# Patient Record
Sex: Female | Born: 1935 | Race: White | Hispanic: No
Health system: Southern US, Community
[De-identification: ages and names within clinical notes are randomized; demographics above are authoritative.]

---

## 2014-01-25 ENCOUNTER — Inpatient Hospital Stay: Payer: Self-pay | Admitting: Internal Medicine

## 2014-01-25 LAB — CBC WITH DIFFERENTIAL/PLATELET
Basophil #: 0.1 10*3/uL (ref 0.0–0.1)
Basophil %: 1.1 %
EOS ABS: 0.2 10*3/uL (ref 0.0–0.7)
Eosinophil %: 1.4 %
HCT: 34.9 % — ABNORMAL LOW (ref 35.0–47.0)
HGB: 11.8 g/dL — ABNORMAL LOW (ref 12.0–16.0)
LYMPHS PCT: 21.7 %
Lymphocyte #: 2.5 10*3/uL (ref 1.0–3.6)
MCH: 32 pg (ref 26.0–34.0)
MCHC: 33.8 g/dL (ref 32.0–36.0)
MCV: 95 fL (ref 80–100)
MONO ABS: 1 x10 3/mm — AB (ref 0.2–0.9)
MONOS PCT: 8.6 %
Neutrophil #: 7.7 10*3/uL — ABNORMAL HIGH (ref 1.4–6.5)
Neutrophil %: 67.2 %
Platelet: 198 10*3/uL (ref 150–440)
RBC: 3.69 10*6/uL — ABNORMAL LOW (ref 3.80–5.20)
RDW: 12.9 % (ref 11.5–14.5)
WBC: 11.4 10*3/uL — ABNORMAL HIGH (ref 3.6–11.0)

## 2014-01-25 LAB — COMPREHENSIVE METABOLIC PANEL
Albumin: 3.6 g/dL (ref 3.4–5.0)
Alkaline Phosphatase: 86 U/L
Anion Gap: 9 (ref 7–16)
BILIRUBIN TOTAL: 0.5 mg/dL (ref 0.2–1.0)
BUN: 19 mg/dL — ABNORMAL HIGH (ref 7–18)
CHLORIDE: 106 mmol/L (ref 98–107)
CO2: 22 mmol/L (ref 21–32)
CREATININE: 1.45 mg/dL — AB (ref 0.60–1.30)
Calcium, Total: 9 mg/dL (ref 8.5–10.1)
EGFR (Non-African Amer.): 36 — ABNORMAL LOW
GFR CALC AF AMER: 44 — AB
Glucose: 200 mg/dL — ABNORMAL HIGH (ref 65–99)
Osmolality: 282 (ref 275–301)
Potassium: 3.9 mmol/L (ref 3.5–5.1)
SGOT(AST): 37 U/L (ref 15–37)
SGPT (ALT): 44 U/L
SODIUM: 137 mmol/L (ref 136–145)
Total Protein: 6.7 g/dL (ref 6.4–8.2)

## 2014-01-25 LAB — PROTIME-INR
INR: 1.7
PROTHROMBIN TIME: 19.9 s — AB (ref 11.5–14.7)

## 2014-01-25 LAB — CLOSTRIDIUM DIFFICILE(ARMC)

## 2014-01-25 LAB — LIPASE, BLOOD: Lipase: 78 U/L (ref 73–393)

## 2014-01-26 LAB — CBC WITH DIFFERENTIAL/PLATELET
BASOS PCT: 0.8 %
Basophil #: 0.1 10*3/uL (ref 0.0–0.1)
EOS ABS: 0.2 10*3/uL (ref 0.0–0.7)
EOS PCT: 2.5 %
HCT: 33.6 % — ABNORMAL LOW (ref 35.0–47.0)
HGB: 11.2 g/dL — ABNORMAL LOW (ref 12.0–16.0)
Lymphocyte #: 1.8 10*3/uL (ref 1.0–3.6)
Lymphocyte %: 21.2 %
MCH: 31.9 pg (ref 26.0–34.0)
MCHC: 33.4 g/dL (ref 32.0–36.0)
MCV: 96 fL (ref 80–100)
MONOS PCT: 8.5 %
Monocyte #: 0.7 x10 3/mm (ref 0.2–0.9)
NEUTROS PCT: 67 %
Neutrophil #: 5.7 10*3/uL (ref 1.4–6.5)
PLATELETS: 188 10*3/uL (ref 150–440)
RBC: 3.51 10*6/uL — ABNORMAL LOW (ref 3.80–5.20)
RDW: 12.9 % (ref 11.5–14.5)
WBC: 8.5 10*3/uL (ref 3.6–11.0)

## 2014-01-26 LAB — PROTIME-INR
INR: 1.7
PROTHROMBIN TIME: 19.3 s — AB (ref 11.5–14.7)

## 2014-01-27 LAB — BASIC METABOLIC PANEL
Anion Gap: 9 (ref 7–16)
BUN: 10 mg/dL (ref 7–18)
CALCIUM: 8.4 mg/dL — AB (ref 8.5–10.1)
Chloride: 109 mmol/L — ABNORMAL HIGH (ref 98–107)
Co2: 25 mmol/L (ref 21–32)
Creatinine: 1.21 mg/dL (ref 0.60–1.30)
GFR CALC AF AMER: 54 — AB
GFR CALC NON AF AMER: 45 — AB
GLUCOSE: 106 mg/dL — AB (ref 65–99)
OSMOLALITY: 284 (ref 275–301)
Potassium: 3.9 mmol/L (ref 3.5–5.1)
SODIUM: 143 mmol/L (ref 136–145)

## 2014-01-27 LAB — PROTIME-INR
INR: 2.1
PROTHROMBIN TIME: 23.2 s — AB (ref 11.5–14.7)

## 2014-07-11 NOTE — H&P (Signed)
PATIENT NAME:  Tanya Mata, Tanya Mata MR#:  [id number] DATE OF BIRTH:  1924/12/03  PRIMARY DOCTOR:  Is from [place].  EMERGENCY ROOM PHYSICIAN:  Eartha InchCory R. York CeriseForbach, MD  CHIEF COMPLAINT: Diarrhea.   HISTORY OF PRESENT ILLNESS: An 81 year old female visiting her daughter. She is from [place] and the daughter is in [place].  The patient noticed to have diarrhea and she was at [facility] hospital for a day for diverticulitis, discharged on Friday. The patient still has diarrhea and abdominal pain, decreased p.o. intake. She was given Cipro and Flagyl on discharge from the hospital in [place]. She took Friday and Saturday. The patient had diarrhea nonstop and then abdominal pain. Did not take any medications today. She is evaluated in the Emergency Room and she has Clostridium difficile positive. We are admitting for C. difficile positive with persistent diarrhea.   Denies any nausea, vomiting, but unable to eat anything since yesterday due to her diarrhea. She is on liquid diet anyway for past 3 days. Denies other complaints. No fever. No cough. Traveling from [place] to visit her children in [place], [place]  Usually active and drives her car and lives by herself in [place].   PAST MEDICAL HISTORY: Significant for diverticulosis, history of multiple colonoscopies,  one recently a year ago showed diverticula, but negative for cancer. She has a history of hypertension, diabetes, chronic atrial fibrillation.   ALLERGIES: CODEINE.   SOCIAL HISTORY: Lives alone by herself. Independent for activities of daily living. The patient denies any smoking or drugs and no alcohol.   PAST SURGICAL HISTORY: Significant for history of hysterectomy before and gallbladder surgery.   MEDICATIONS: Sotalol 80 mg p.o. t.i.d., simvastatin 10 mg p.o. at bedtime, glipizide 5 mg p.o. b.i.d., Bentyl 10 mg p.o. t.i.d., Advair Diskus 250/50 b.i.d., Coumadin 4 mg daily.   REVIEW OF SYSTEMS:   CONSTITUTIONAL: The patient has no fever. No fatigue.  EYES: No blurred vision.  EARS, NOSE AND THROAT: No tinnitus. No epistaxis. No difficulty swallowing.  RESPIRATORY: No cough. No wheezing.  CARDIOVASCULAR: No chest pain. No orthopnea.  GASTROINTESTINAL: The patient has diarrhea and abdominal pain going on for 3 days, has some nausea, but denies any vomiting.  GENITOURINARY: No dysuria.  ENDOCRINE: No polyuria or polydipsia.  HEMATOLOGIC: No anemia.  MUSCULOSKELETAL: No joint pain.  NEUROLOGIC: No numbness and weakness. No dysarthria.   PSYCHIATRIC: No anxiety or insomnia.   PHYSICAL EXAMINATION: VITAL SIGNS: Temperature 98 Fahrenheit, heart rate 67, blood pressure 162/70, saturation 97% on room air.  GENERAL: Alert, awake, and oriented. An 81 year old female not in distress HEAD:  Atraumatic, normocephalic. EYES: Pupils equal, reacting to light. Extraocular movements intact.  EARS, NOSE, AND THROAT: No tympanic membrane congestion. No turbinate hypertrophy.  No oropharyngeal erythema.  NECK: Supple. No JVD. No carotid bruit. RESPIRATORY:  Clear to auscultation. No wheeze, no rales.  CARDIOVASCULAR: S1, S2 irregularly irregular. PMI not displaced.  GASTROINTESTINAL: Abdomen is slightly tender in epigastric area. No rebound tenderness. No organomegaly. No hernias.  MUSCULOSKELETAL: Able to move extremities x 4. No tenderness or effusion. Normal range of motion. Strength and tone are equal bilaterally.  SKIN: Inspection is normal. Skin turgor is decreased.  VASCULAR: Good pedal pulses.  NEUROLOGICAL: Alert, awake, oriented. Cranial nerves II through XII are intact. Power 5/5 in upper and lower extremities. Sensation intact. Deep tendon reflexes 2+ bilaterally.  PSYCHIATRIC: Mood and affect are within normal limits.   LABORATORY DATA:  Electrolytes, sodium 137, potassium 3.9, chloride 106,  bicarbonate 22, BUN 19, creatinine 1.45, glucose 200. LFTs are within normal limits. Lipase 78,  INR 1.7. WBC 11.4, hemoglobin 11.8, hematocrit 34.9, platelets 198,000.   IMAGING:  Abdominal CAT scan shows air-fluid levels in the colon, small bowel, adynamic ileus.  She has multiple diverticula. No diverticulitis. The patient's Clostridium difficile toxicology is positive for C. difficile antigen.    EKG shows sinus bradycardia at 49 beats per minute.   ASSESSMENT AND PLAN: 1.  This patient is an 81 year old female with persistent diarrhea with Clostridium difficile colitis. Admit her to hospitalist service. Start her on vancomycin 250 mg > q. 4 hours along with probiotics. Hold the Cipro and Flagyl at this time.  2.   Mild acute renal failure secondary to dehydration with diarrhea and poor p.o. intake for 3 days, so start intravenous fluids and monitor kidney function.  3.   Sinus bradycardia. Hold the sotalol and monitor on telemetry.  4.  History of diabetes mellitus type 2. The patient is taking glipizide at home, but we are going to do sliding scale with coverage and restart glipizide tomorrow.  5.  The patient has hyperlipidemia. Continue statins. 6.  History of irritable bowel syndrome. She is on Bentyl. Continue that.  7.  The patient has chronic atrial fibrillation. INR is 1.7 Coumadin, she takes at 4 mg, continue that.   TIME SPENT: About 55 minutes.    ____________________________ Katha Hamming, MD sk:LT D: 01/25/2014 19:09:42 ET T: 01/25/2014 19:28:18 ET JOB#: [id number]  cc: Katha Hamming, MD, <Dictator> Katha Hamming MD ELECTRONICALLY SIGNED 03/01/2014 23:30

## 2014-07-11 NOTE — Discharge Summary (Signed)
PATIENT NAME:  Tanya Mata, [name] MR#:  [id number] DATE OF BIRTH:  November 08, 1924  DATE OF ADMISSION:  01/25/2014 DATE OF DISCHARGE:  01/27/2014  CHIEF COMPLAINT AT THE TIME OF ADMISSION: Diarrhea.   ADMITTING DIAGNOSES:  1.  Persistent diarrhea secondary to Clostridium difficile colitis.  2.  Mild acute kidney injury secondary to dehydration.  3.  Sinus bradycardia, holding sotalol.   4.  Diabetes mellitus type 2.  5.  Hyperlipidemia.  6.  Irritable bowel syndrome.  7.  Chronic atrial fibrillation, on Coumadin.   DISCHARGE DIAGNOSES:  1.  Diarrhea from Clostridium difficile colitis, resolved.  2.  Acute kidney injury improved with intravenous fluids.  3.  Sinus bradycardia, continue to hold sotalol until evaluated by cardiology.  4.  Chronic history of atrial fibrillation, rate controlled, international normalized ratio is therapeutic, continue Coumadin.  5.  Diabetes mellitus, continue home medications.  6.  Hyperlipidemia, continue statin.  7.  Irritable bowel syndrome, continue Bentyl.   CONSULTATIONS: None.   PROCEDURES: None.   BRIEF HISTORY AND HOSPITAL COURSE: The patient is an 81 year old female who is visiting from [place] to visit her daughter in [place]. Initially she went to visit her daughter in [place], and at that time she started having diarrhea. The patient was evaluated in [facility] and discharged. Please review the history and physical for details. Though she was discharged she was persistently having diarrhea and abdominal pain with  decreased p.o. intake. At the time of discharge at [facility] she was given Cipro and Flagyl which she took for 2 days and stopped taking them. The patient came into the ED at [facility]. Stool for C. difficile toxin was positive and she is admitted to the hospital with a diagnosis of C. difficile colitis.   HOSPITAL COURSE:  1.  The patient was started on p.o. vancomycin as well as  probiotics. Ciprofloxacin and Flagyl were held.  With p.o. vancomycin her condition significantly improved,  diarrhea has gotten better. She started having some stool. Tolerating p.o. Abdominal pain is better.  2.  Acute renal failure is resolved with IV fluids.  3.  The patient was noticed to have sinus bradycardia, but the patient is on sotalol, which was held. Subsequently bradycardia was significantly improved. The patient was recommended to hold her sotalol until she is evaluated by her cardiologist in [place]. The patient sees Dr. Orvan Falconerampbell as an outpatient. She prefers following up with Dr. Orvan Falconerampbell and her cardiologist in [place].  4.  The patient has chronic history of atrial fibrillation. The rate is controlled. She is on Coumadin, which is resumed.  INR is therapeutic at the time of discharge.  5.  For history of irritable bowel syndrome, the patient is provided with Bentyl  6.  Hyperlipidemia. Continue statin.  7.  Diabetes mellitus. Continue home medication glipizide.  8.  The patient's overall condition is improved significantly. Plan is to discharge the patient home in stable condition.   LABORATORIES AND IMAGING STUDIES: BMP: Glucose is 106, BUN and creatinine are normal on November 10. GFR is 45. Anion gap is normal. Serum osmolality is normal. Calcium is 8.4. WBC 8.5, hemoglobin is 11.2, platelets are 188. On November 10 PT is 23.2 and INR is 2.1. At the time of admission stool for C. difficile toxin is positive, PCR is also positive. The patient had a CAT scan of the abdomen and pelvis on November 8.  This was done with contrast. The report has revealed air  fluid levels noted in the small bowel, which are nonspecific, adynamic ileus is suspected, no evidence of obstruction.  Numerous sigmoid colon diverticula.  No diverticulitis. Prominent dilatation of the intra- and extrahepatic bile ducts with normal distal tapering. This is presumed to be chronic in this post cholecystectomy  patient.   Vital signs at the time of discharge:  Temperature 98 degrees Fahrenheit, pulse 66, respirations 18,  pulse oximetry 99% on room air.   MEDICATIONS AT THE TIME OF DISCHARGE:  Coumadin 4 mg p.o. once daily, glipizide 5 mg p.o. 2 times a day, simvastatin 10 mg p.o. once daily, tramadol 50 mg 1 tablet p.o. every 4 hours as needed for pain, Dexilant 60 mg 1 capsule p.o. once daily, Advair 100/50 one puff inhalation 2 times a day, Allegra 1 tablet p.o. once daily, Align 4 mg 1 capsule p.o. once daily, Viactiv soft calcium chews, 1 chew orally 2 times a day, Centrum Silver Therapeutic multivitamins 1 tablet p.o. once daily, dicyclomine 10 mg 1 capsule p.o. 2 times a day,  Tylenol 325 mg 2 tablets p.o. daily, vancomycin 250 mg/5 mL, take 2.5 mL p.o. every 6 hours for 10 more days, lactobacillus 1 capsule p.o. 2 times a day for 10 days, promethazine 12.5 mg p.o. every 8 hours as needed for nausea and vomiting.   DIET AT THE TIME OF DISCHARGE: Low fat, low cholesterol, regular consistency.   ACTIVITY: As tolerated.   DISCHARGE INSTRUCTIONS: Follow up with primary care physician in [place], Dr. Orvan Falconer, in a week and follow up with cardiology in [place] regarding her bradycardia. Sotalol is currently on hold.     Diagnoses and plan of care was discussed in detail with the patient. She verbalized understanding of the plan.   TOTAL TIME SPENT ON THE DISCHARGE: 45 minutes.     ____________________________ Ramonita Lab, MD ag:AT D: 01/29/2014 21:18:38 ET T: 01/29/2014 23:29:28 ET JOB#: [id number]  cc: Ramonita Lab, MD, <Dictator> Dr. Orvan Falconer in [place] MD ELECTRONICALLY SIGNED 02/04/2014 22:08

## 2016-06-06 IMAGING — CT CT ABD-PELV W/ CM
2 of 5 series · 14 of 46 positions shown, 16 images · IV contrast (isovue)
Comparison: None.

CLINICAL DATA: Lower abd pain, bloody diarhhea started last night,
recent admission in [place] for diverticulitis-d/c's [DATE], elevated wbc,
reduced dose of 75 ml isovue 300 given due to gfr of 36, hx of TAH
and cholecystectomy, no hx of cancer.

EXAM:
CT ABDOMEN AND PELVIS WITH CONTRAST
TECHNIQUE: Multidetector CT imaging of the abdomen and pelvis was performed
using the standard protocol following bolus administration of
intravenous contrast.
CONTRAST:  75 mL of 1sovue-FNN intravenous contrast.

[Series 2: routine abd pel with · axial · 0.62mm/px · z∈[-428,-32]mm · 11 of 89 slices shown, 13 images]
[im 5/89  soft-tissue]
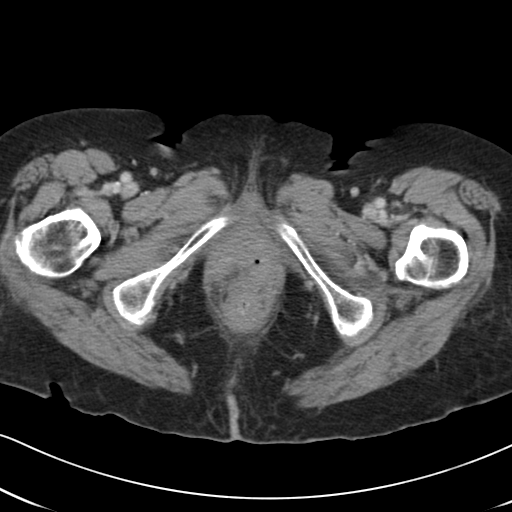
[im 5/89  bone]
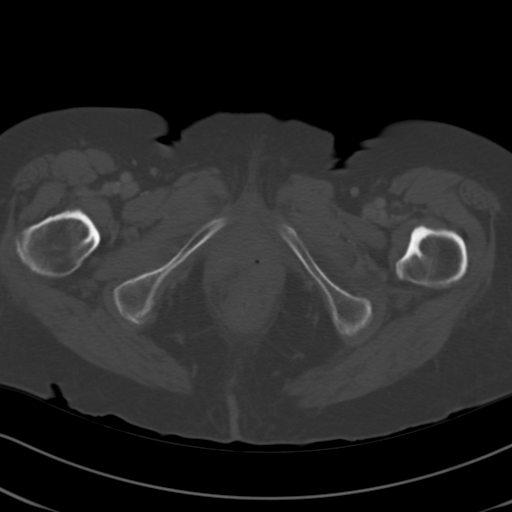
[im 13/89  soft-tissue]
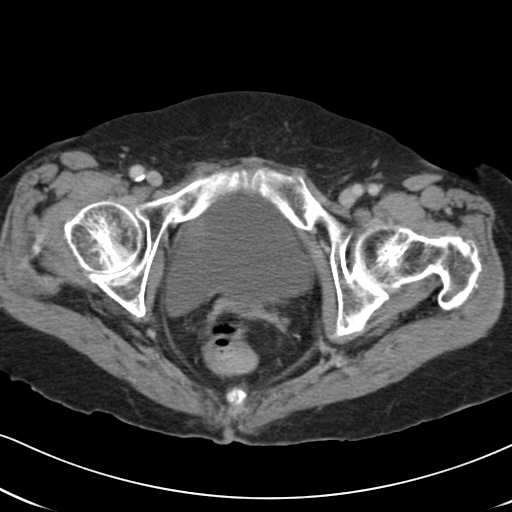
[im 21/89  soft-tissue]
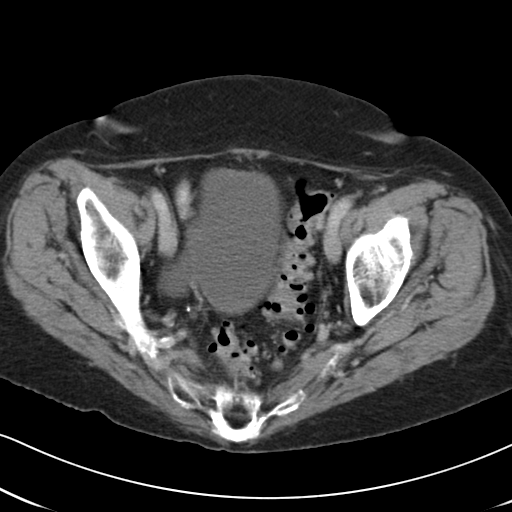
[im 30/89  soft-tissue]
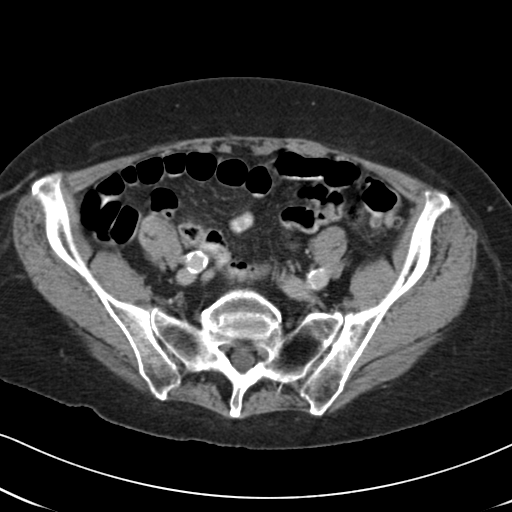
[im 38/89  soft-tissue]
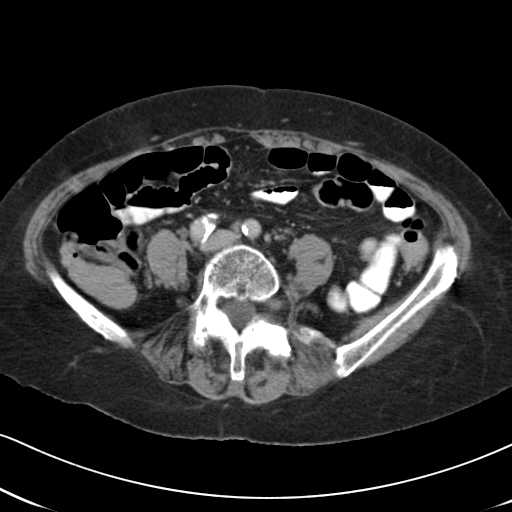
[im 47/89  soft-tissue]
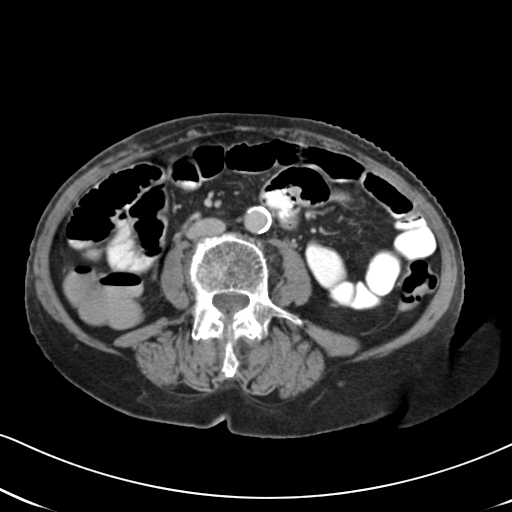
[im 51/89  soft-tissue]
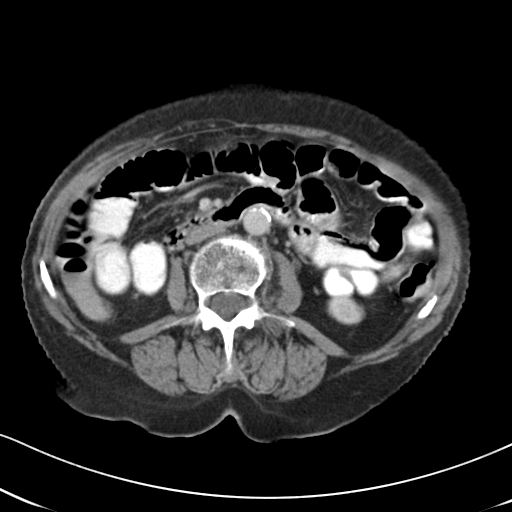
[im 59/89  soft-tissue]
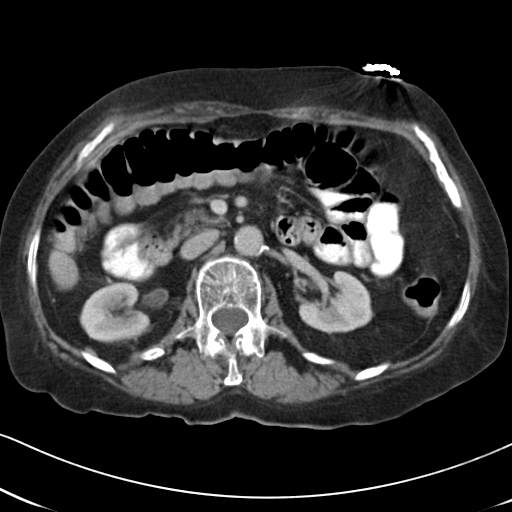
[im 68/89  soft-tissue]
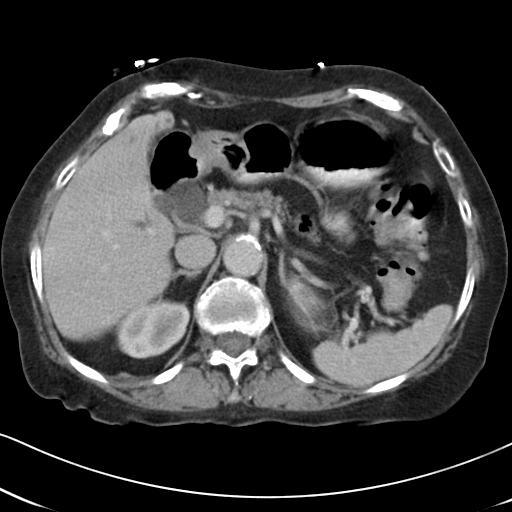
[im 68/89  bone]
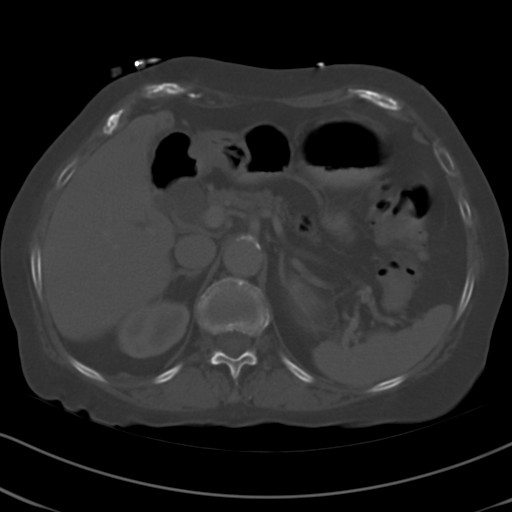
[im 76/89  soft-tissue]
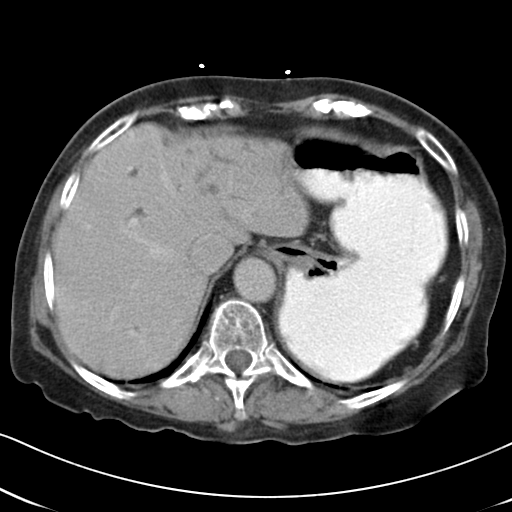
[im 84/89  soft-tissue]
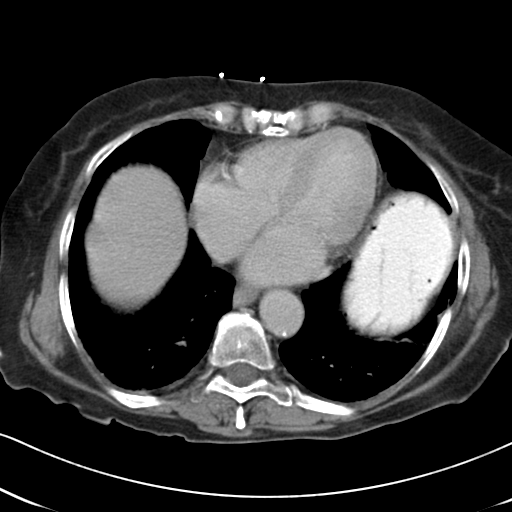

[Series 6: cor routine abd pel with · coronal · 0.54mm/px · 3 of 112 slices shown]
[im 38/112  soft-tissue]
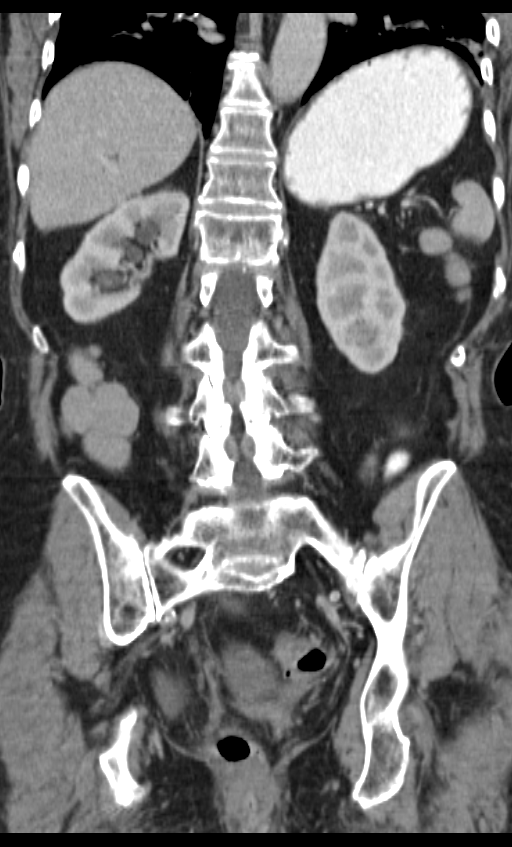
[im 50/112  soft-tissue]
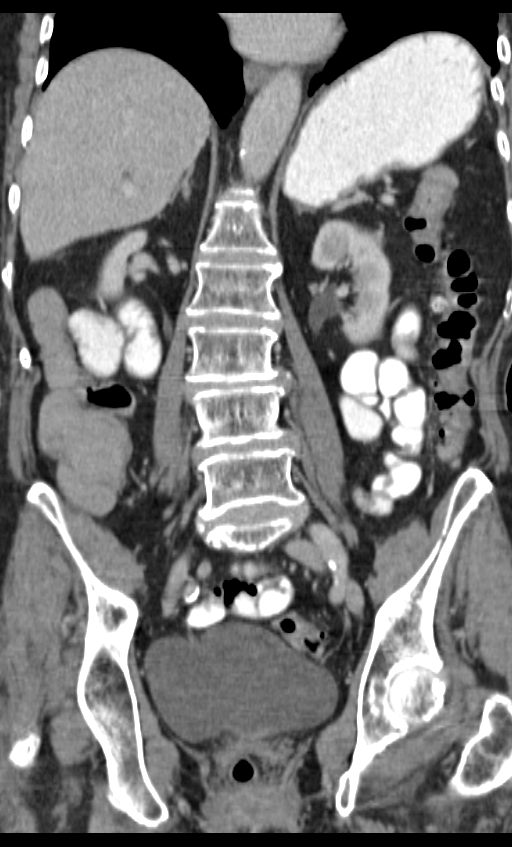
[im 62/112  soft-tissue]
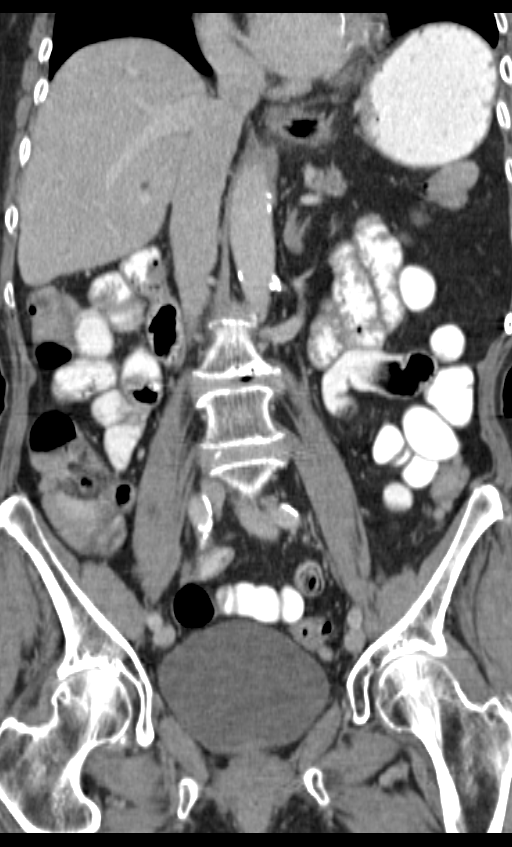

[14 of 46 positions shown; findings below may reference images not displayed]

FINDINGS: There are numerous sigmoid colon diverticula with diverticula
scattered along the lower descending colon. There is no evidence of
diverticulitis. Colon is normal in caliber with no wall thickening
or adjacent inflammation. Small bowel is normal in caliber with no
wall thickening. There are scattered colonic and small bowel
air-fluid levels, nonspecific.

Lung bases show coarse reticular type opacities likely combination
of subsegmental atelectasis and scarring. Heart is top-normal in
size.

There is significant intra and extrahepatic bile duct dilation.
Common bile duct measures 18 mm in the porta hepatis, tapering
distally. No duct stone is seen. There is no pancreatic mass. This
is presumed to be chronic.

Liver is normal in size and attenuation.  No liver mass.

Normal spleen.  No pancreatic inflammation.  No adrenal masses.

Mild renal cortical thinning. No renal masses or stones. No
hydronephrosis. Normal ureters. Normal bladder.

Uterus is surgically absent.  No pelvic masses.

Atherosclerotic calcifications noted along a normal caliber
abdominal aorta and its branch vessels. No significant stenosis. No
adenopathy. No ascites.

Degenerative changes along the visualized spine. Grade 1
anterolisthesis of L4 on L5. Bones are demineralized. No
osteoblastic or osteolytic lesions.
IMPRESSION: 1. Air-fluid levels noted in the colon small bowel, nonspecific. A
mild adynamic ileus is suspected. No evidence of obstruction.
2. Numerous sigmoid colon diverticula.  No diverticulitis.
3. Prominent dilation of the intra and extrahepatic bile ducts with
normal distal tapering. This is presumed to be chronic in this post
cholecystectomy patient.
4. Other chronic findings as detailed above.

## 2018-08-19 DEATH — deceased
